# Patient Record
Sex: Female | Born: 1965 | Race: White | Hispanic: No | Marital: Married
Health system: Southern US, Community
[De-identification: ages and names within clinical notes are randomized; demographics above are authoritative.]

---

## 2005-07-10 ENCOUNTER — Ambulatory Visit (HOSPITAL_COMMUNITY): Admission: RE | Admit: 2005-07-10 | Discharge: 2005-07-10 | Payer: Self-pay | Admitting: Obstetrics and Gynecology

## 2005-07-14 ENCOUNTER — Inpatient Hospital Stay (HOSPITAL_COMMUNITY): Admission: AD | Admit: 2005-07-14 | Discharge: 2005-07-15 | Payer: Self-pay | Admitting: Obstetrics and Gynecology

## 2005-08-25 ENCOUNTER — Other Ambulatory Visit: Admission: RE | Admit: 2005-08-25 | Discharge: 2005-08-25 | Payer: Self-pay | Admitting: Obstetrics and Gynecology

## 2009-07-12 ENCOUNTER — Encounter: Admission: RE | Admit: 2009-07-12 | Discharge: 2009-07-12 | Payer: Self-pay | Admitting: Obstetrics and Gynecology

## 2010-05-04 ENCOUNTER — Encounter: Admission: RE | Admit: 2010-05-04 | Discharge: 2010-05-04 | Payer: Self-pay | Admitting: Gastroenterology

## 2010-05-19 ENCOUNTER — Ambulatory Visit (HOSPITAL_COMMUNITY): Admission: RE | Admit: 2010-05-19 | Discharge: 2010-05-19 | Payer: Self-pay | Admitting: Gastroenterology

## 2010-08-16 ENCOUNTER — Other Ambulatory Visit: Payer: Self-pay | Admitting: Gastroenterology

## 2010-08-21 ENCOUNTER — Encounter: Payer: Self-pay | Admitting: Gastroenterology

## 2010-09-22 ENCOUNTER — Other Ambulatory Visit (HOSPITAL_COMMUNITY): Payer: BC Managed Care – PPO

## 2010-09-27 ENCOUNTER — Encounter (HOSPITAL_COMMUNITY)
Admission: RE | Admit: 2010-09-27 | Discharge: 2010-09-27 | Disposition: A | Discharge status: Home / Self Care | Payer: BC Managed Care – PPO | Source: Ambulatory Visit | Attending: General Surgery | Admitting: General Surgery

## 2010-09-27 DIAGNOSIS — Z01812 Encounter for preprocedural laboratory examination: Secondary | ICD-10-CM | POA: Insufficient documentation

## 2010-09-27 LAB — COMPREHENSIVE METABOLIC PANEL
ALT: <8 U/L (ref 0–35)
AST: 14 U/L (ref 0–37)
Albumin: 3.5 g/dL (ref 3.5–5.2)
Alkaline Phosphatase: 58 U/L (ref 39–117)
BUN: 15 mg/dL (ref 6–23)
CO2: 30 mEq/L (ref 19–32)
Calcium: 9.3 mg/dL (ref 8.4–10.5)
Chloride: 106 mEq/L (ref 96–112)
GFR calc Af Amer: >60 mL/min (ref >60)
Glucose, Bld: 101 mg/dL — ABNORMAL HIGH (ref 70–99)
Potassium: 4.8 mEq/L (ref 3.5–5.1)
Sodium: 143 mEq/L (ref 135–145)
Total Protein: 6.3 g/dL (ref 6.0–8.3)

## 2010-09-27 LAB — CBC
HCT: 38.2 % (ref 36.0–46.0)
Hemoglobin: 12.3 g/dL (ref 12.0–15.0)
MCHC: 32.2 g/dL (ref 30.0–36.0)
MCV: 82.2 fL (ref 78.0–100.0)
Platelets: 322 10*3/uL (ref 150–400)
RBC: 4.65 MIL/uL (ref 3.87–5.11)
RDW: 14.5 % (ref 11.5–15.5)
WBC: 7.4 10*3/uL (ref 4.0–10.5)

## 2010-09-27 LAB — DIFFERENTIAL
Basophils Absolute: 0.1 10*3/uL (ref 0.0–0.1)
Basophils Relative: 1 % (ref 0–1)
Eosinophils Absolute: 0.2 10*3/uL (ref 0.0–0.7)
Eosinophils Relative: 3 % (ref 0–5)
Lymphocytes Relative: 28 % (ref 12–46)
Lymphs Abs: 2.1 10*3/uL (ref 0.7–4.0)
Monocytes Absolute: 0.7 10*3/uL (ref 0.1–1.0)
Monocytes Relative: 10 % (ref 3–12)

## 2010-09-27 LAB — SURGICAL PCR SCREEN
MRSA, PCR: NEGATIVE
Staphylococcus aureus: POSITIVE — AB

## 2010-09-29 ENCOUNTER — Observation Stay (HOSPITAL_COMMUNITY)
Admission: RE | Admit: 2010-09-29 | Discharge: 2010-09-30 | Disposition: A | Discharge status: Home / Self Care | Payer: BC Managed Care – PPO | Source: Ambulatory Visit | Attending: General Surgery | Admitting: General Surgery

## 2010-09-29 ENCOUNTER — Other Ambulatory Visit: Payer: Self-pay | Admitting: General Surgery

## 2010-09-29 ENCOUNTER — Inpatient Hospital Stay (HOSPITAL_COMMUNITY): Payer: BC Managed Care – PPO

## 2010-09-29 DIAGNOSIS — Z01818 Encounter for other preprocedural examination: Secondary | ICD-10-CM | POA: Insufficient documentation

## 2010-09-29 DIAGNOSIS — Z01812 Encounter for preprocedural laboratory examination: Secondary | ICD-10-CM | POA: Insufficient documentation

## 2010-09-29 DIAGNOSIS — K8010 Calculus of gallbladder with chronic cholecystitis without obstruction: Principal | ICD-10-CM | POA: Insufficient documentation

## 2010-10-03 ENCOUNTER — Ambulatory Visit (HOSPITAL_COMMUNITY): Admission: RE | Admit: 2010-10-03 | Payer: BC Managed Care – PPO | Source: Ambulatory Visit | Admitting: General Surgery

## 2010-10-03 NOTE — Op Note (Signed)
Erin [name], Erin [name]             ACCOUNT NO.:  [id number]  MEDICAL RECORD NO.:  [id number]           PATIENT TYPE:  I  LOCATION:  5122                         FACILITY:  [facility]  PHYSICIAN:  Adolph Pollack, M.D.DATE OF BIRTH:  [birth year]  DATE OF PROCEDURE:  09/29/2010 DATE OF DISCHARGE:                              OPERATIVE REPORT   PREOPERATIVE DIAGNOSIS:  Gallbladder polyp.  POSTOPERATIVE DIAGNOSIS:  Gallbladder polyp.  PROCEDURE:  Laparoscopic cholecystectomy with intraoperative cholangiogram.  SURGEON:  Adolph Pollack, MD  ASSISTANT:  Clovis Pu. Cornett, MD  ANESTHESIA:  General.  INDICATIONS:  Erin [name] is a 45 year old female who has had some nausea and vomiting and diarrhea.  She has been having episodes like this every 4-6 months.  She was started to have some epigastric pain.  She had an ultrasound demonstrating a 5-mm nonmobile lesion which was thought to be a polyp versus stone.  She had a normal gallbladder ejection fraction on a nuclear medicine study.  She had extensive workup by Dr. Dulce Sellar including upper endoscopy.  Nothing could really explain her symptoms and thus she presents now for laparoscopic cholecystectomy.  We discussed the procedure risks, aftercare, and the fact that this may or may not alleviate her symptoms and she understands this.  TECHNIQUE:  She was brought to the operating room and placed supine on the operating table and general anesthetic was administered.  The abdominal wall was sterilely prepped and draped.  Marcaine solution was infiltrated into the subumbilical region.  A small subumbilical incision was made through the skin and subcutaneous tissue until the midline fascia was identified.  A small incision was made in the midline and fascia, the fascia was retracted anteriorly, the peritoneal cavity was entered under direct vision.  A purse-string suture of 0 Vicryl was placed around the fascial edges.  A Hasson  trocar was introduced into the peritoneal cavity and pneumoperitoneum was created by insufflation of CO2 gas.  Next, a laparoscope was introduced.  A 5-mm trocar was placed through an epigastric incision and two 5-mm trocars were placed in the right upper quadrant.  She was placed in reverse Trendelenburg position, the right side tilted slightly up.  The fundus of the gallbladder was grasped. There were noted to be adhesions between the omentum and the duodenum and the body the gallbladder, which was lysed sharply allowing these structures to fall away from the gallbladder.  I then mobilized the infundibulum of the gallbladder and retracted it laterally.  Using blunt dissection, I was able to identify the cystic duct and create a window around it.  There appeared to be a small anterior cystic artery which I was able to clip and divide after making a window around it.  The critical view was achieved.  Following this, I placed a clip at the cystic duct-gallbladder junction and made a small incision through the cystic duct.  A cholangiocatheter was passed through the anterior abdominal wall into the cystic duct and a cholangiogram was performed.  Under real-time fluoroscopy, dilute contrast was injected into the cystic duct.  Cystic duct, common bile duct, common hepatic duct, and right and  left hepatic ducts were all visualized.  Contrast drained promptly into the duodenum.  There was no obvious evidence of obstruction.  Final reports pending the radiologist interpretation.  Following this, the cholangiocatheter was removed, the cystic duct was clipped 3 times on the biliary side and divided.  I then identified a posterior branch of the cystic artery and it was clipped and divided.  I began dissecting the gallbladder free from liver, but found what could be small blood vessels going directly from the midportion liver to the gallbladder.  The hepatic side of this was clipped and the  other area was divided with electrocautery.  Once the gallbladder was removed from the liver, I then placed an Endopouch bag and removed it through the subumbilical incision.  The subumbilical trocars were placed.  Bleeding was controlled with electrocautery.  Copious irrigation was performed and irrigation fluid evacuated.  Further inspection of the gallbladder fossa demonstrated no evidence of bleeding or bile leak.  A piece of Surgicel was placed in the gallbladder fossa.  Following this, I then removed the subumbilical trocar and closed the fascial defect under laparoscopic vision by tightening up and tying down the purse-string suture.  Skin incisions were closed with 4-0 Monocryl subcuticular stitches.  Steri-Strips and sterile dressings were applied.  She tolerated the procedure well without any apparent complications and was taken to the recovery room in satisfactory condition.     Adolph Pollack, M.D.     Kari Baars  D:  09/29/2010  T:  09/30/2010  Job:  [id number]  cc:   Willis Modena, MD Barry Dienes. Eloise Harman, M.D.  Electronically Signed by Avel Peace M.D. on 10/03/2010 09:53:53 AM

## 2010-10-17 NOTE — Discharge Summary (Signed)
  [name], [name]             ACCOUNT NO.:  [id number]  MEDICAL RECORD NO.:  [id number]           PATIENT TYPE:  I  LOCATION:  5122                         FACILITY:  [facility]  PHYSICIAN:  Adolph Pollack, M.D.DATE OF BIRTH:  1965-12-16  DATE OF ADMISSION:  09/29/2010 DATE OF DISCHARGE:  09/30/2010                              DISCHARGE SUMMARY   FINAL DISCHARGE DIAGNOSES:  Chronic cholecystitis and cholelithiasis.  PROCEDURE:  Laparoscopic cholecystectomy with cholangiogram, September 29, 2010.  INDICATIONS AND REASON FOR ADMISSION:  This 45 year old female has been having some nausea and vomiting as well as episodic epigastric pain.  An ultrasound demonstrated a 5-mm nonmobile lesion which was thought to be a polyp versus a stone.  She now presents for elective laparoscopic cholecystectomy.  She has had an extensive preoperative workup by Dr. Willis Modena.  HOSPITAL COURSE:  She underwent the above procedure and tolerated it well.  By her first postoperative day, she was tolerating a diet and feeling well and able to be discharged.  Of note was that her final pathology came back showing chronic cholecystitis and cholelithiasis.  DISPOSITION:  Discharged home in satisfactory condition.  She is given discharge instructions as well as pain medication.  Activity restrictions and dietary instructions were also given.  She will follow up in the office in 2-3 weeks.     Adolph Pollack, M.D.     Erin [name]  D:  10/08/2010  T:  10/09/2010  Job:  [id number]  cc:   Willis Modena, MD  Electronically Signed by Avel Peace M.D. on 10/17/2010 09:17:46 AM

## 2010-12-16 NOTE — Discharge Summary (Signed)
NAMEGRAELYN, Erin [name]             ACCOUNT NO.:  [id number]   MEDICAL RECORD NO.:  [id number]          PATIENT TYPE:  INP   LOCATION:  9121                          FACILITY:  [facility]   PHYSICIAN:  Huel Cote, M.D. DATE OF BIRTH:  [birth year]   DATE OF ADMISSION:  07/14/2005  DATE OF DISCHARGE:  07/15/2005                                 DISCHARGE SUMMARY   DISCHARGE DIAGNOSES:  1.  Term pregnancy at 40+ weeks delivered.  2.  Status post normal spontaneous vaginal delivery.   DISCHARGE MEDICATIONS:  1.  Motrin 600 mg p.o. every 6 hours.  2.  Percocet one to two tablets p.o. every 4 hours p.r.n.   HOSPITAL COURSE:  The patient is a 45 year old G4 P1-0-2-1 who was admitted  at 40+ weeks gestation for induction of labor given post dates and  favorable. Prenatal care was complicated by advanced maternal age with a  normal first trimester screen. Her quad screen risk was approximately 1/50;  however, she declined amniocentesis. History of postpartum depression, took  Zoloft with her pregnancy without problems. Prenatal labs are as follows:  B  positive, antibody negative, RPR nonreactive, rubella equivocal, hepatitis B  surface antigen negative, HIV declined, GC negative, chlamydia negative,  group B strep negative, triple screen 1/50, and 1-hour Glucola 106. Past  medical history:  History of anxiety and depression which was stable  throughout pregnancy off medications. Past surgical history:  None. Past OB  history:  She had a vacuum delivery in 2003 of an 8-pound 2-ounce infant  with a fourth-degree laceration. Past GYN history:  None. Allergies:  None.  Medications:  None. She was afebrile on admission with stable vital signs.  Fetal heart rate was reactive. Cervix was 70, 3+, and -2 station. She had  rupture of membranes with clear fluid noted and was placed on Pitocin. She  progressed well throughout the day, reached complete dilation, and pushed  for approximately 10 minutes  with a normal spontaneous vaginal delivery of a  vigorous female infant over a second-degree laceration. Apgars were 9 and 9,  weight was 8 pounds 14 ounces. Placenta delivered spontaneously. Placenta  was sent for cord blood donation and cervix and rectum were intact.  Estimated blood loss was 400 cc. She had a large amount of clot evacuated  from her uterus and then good contraction of the fundus was noted. Estimated  blood loss was approximately 400 mL. Second-degree laceration was repaired  with 2-0 Vicryl. She did quite well and on postpartum day #1 her hemoglobin  was 8.9. Fundus was firm, bleeding was normal, and she requested early  discharge if possible. She was granted early discharge and will follow up in  the office for her routine 6-week postpartum visit.      Huel Cote, M.D.  Electronically Signed     KR/MEDQ  D:  08/16/2005  T:  08/16/2005  Job:  [id number]

## 2011-05-10 ENCOUNTER — Other Ambulatory Visit: Payer: Self-pay | Admitting: Obstetrics and Gynecology

## 2011-05-10 DIAGNOSIS — Z1231 Encounter for screening mammogram for malignant neoplasm of breast: Secondary | ICD-10-CM

## 2011-05-15 ENCOUNTER — Ambulatory Visit: Payer: BC Managed Care – PPO

## 2011-05-21 IMAGING — NM NM HEPATO W/GB/PHARM/[PERSON_NAME]
2 series · 7 of 7 positions shown · non-contrast
Comparison: Ultrasound 05/04/2010.

CLINICAL DATA: Abdominal pain

NUCLEAR MEDICINE HEPATOBILIARY IMAGING
TECHNIQUE: Sequential images of the abdomen were obtained [DATE] minutes following intravenous administration of
radiopharmaceutical.
Radiopharmaceutical:  5.2 mCi Mc-99m Choletec
Gallbladder ejection fraction was calculated after administration
of 1.7 mcg CCK.  Post CCK the patient had no pain.

[Series 1: he hepato · 4.75mm/px · 1 of 1 slices shown (1 of 2)]
[im 1/1]
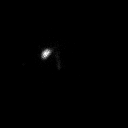

[Series 1: he hepato · 4.75mm/px · 6 of 60 frames shown (2 of 2)]
[frame 6/60]
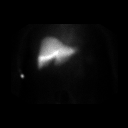
[frame 16/60]
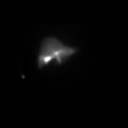
[frame 26/60]
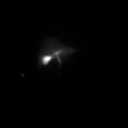
[frame 36/60]
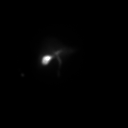
[frame 46/60]
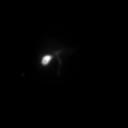
[frame 56/60]
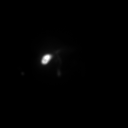

[7 of 7 positions shown; findings below may reference images not displayed]

FINDINGS: There is normal uptake of the tracer by the liver.
Gallbladder is visualized at 15 minutes.   CBD is visualized at 25
minutes.

Post CCK gallbladder ejection fraction is 72.8%.  Normal
gallbladder ejection fraction should be greater than 30%.
IMPRESSION: No cystic duct obstruction.  Post CCK gallbladder ejection fraction
is 72.8%.

## 2011-10-01 IMAGING — RF DG CHOLANGIOGRAM OPERATIVE
1 series · 5 of 5 positions shown · non-contrast
Comparison: None.

CLINICAL DATA: Laparoscopic cholecystectomy

INTRAOPERATIVE CHOLANGIOGRAM
TECHNIQUE: Cholangiographic images from the C-arm fluoroscopic
device were submitted for interpretation post-operatively.  Please
see the procedural report for the amount of contrast and the
fluoroscopy time utilized.

[Series 1: run · 2 acquisitions, 5 frames shown]
[im 1/2]
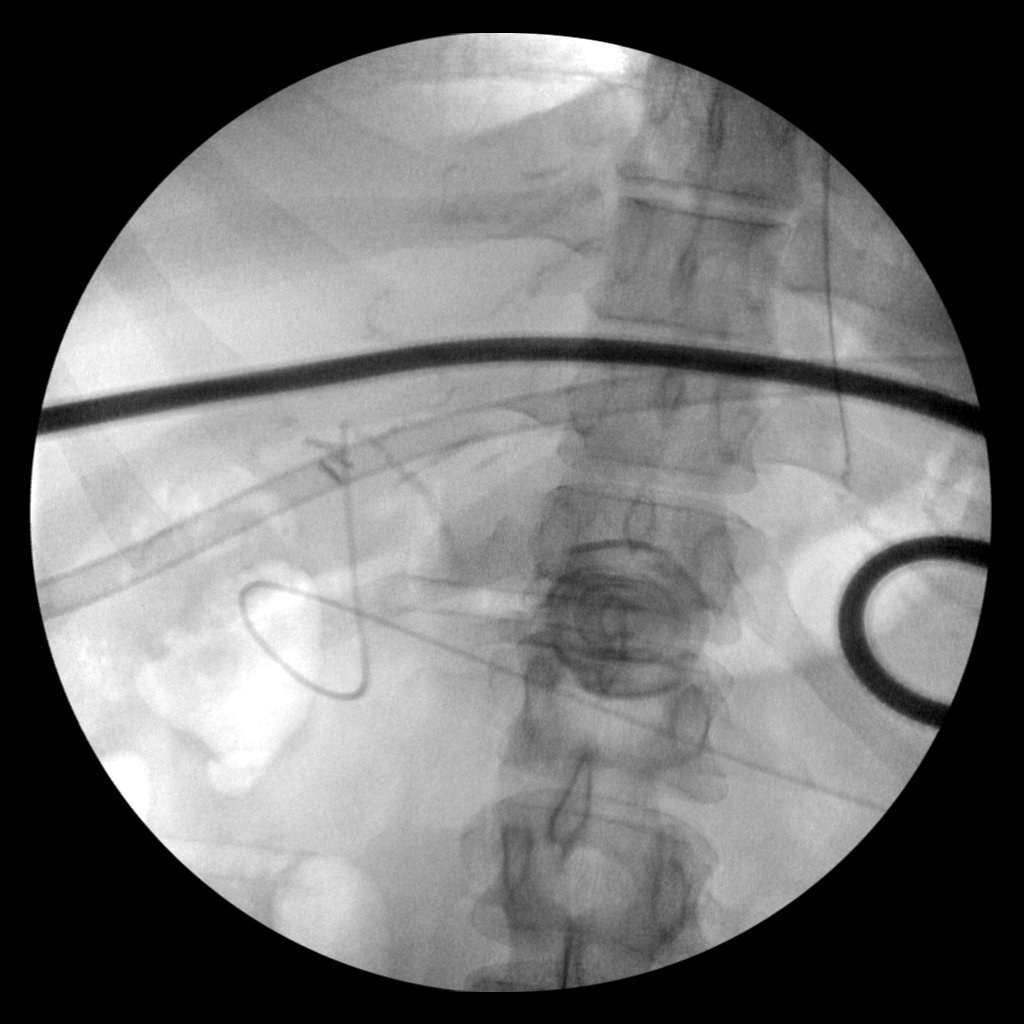
[im 1/2]
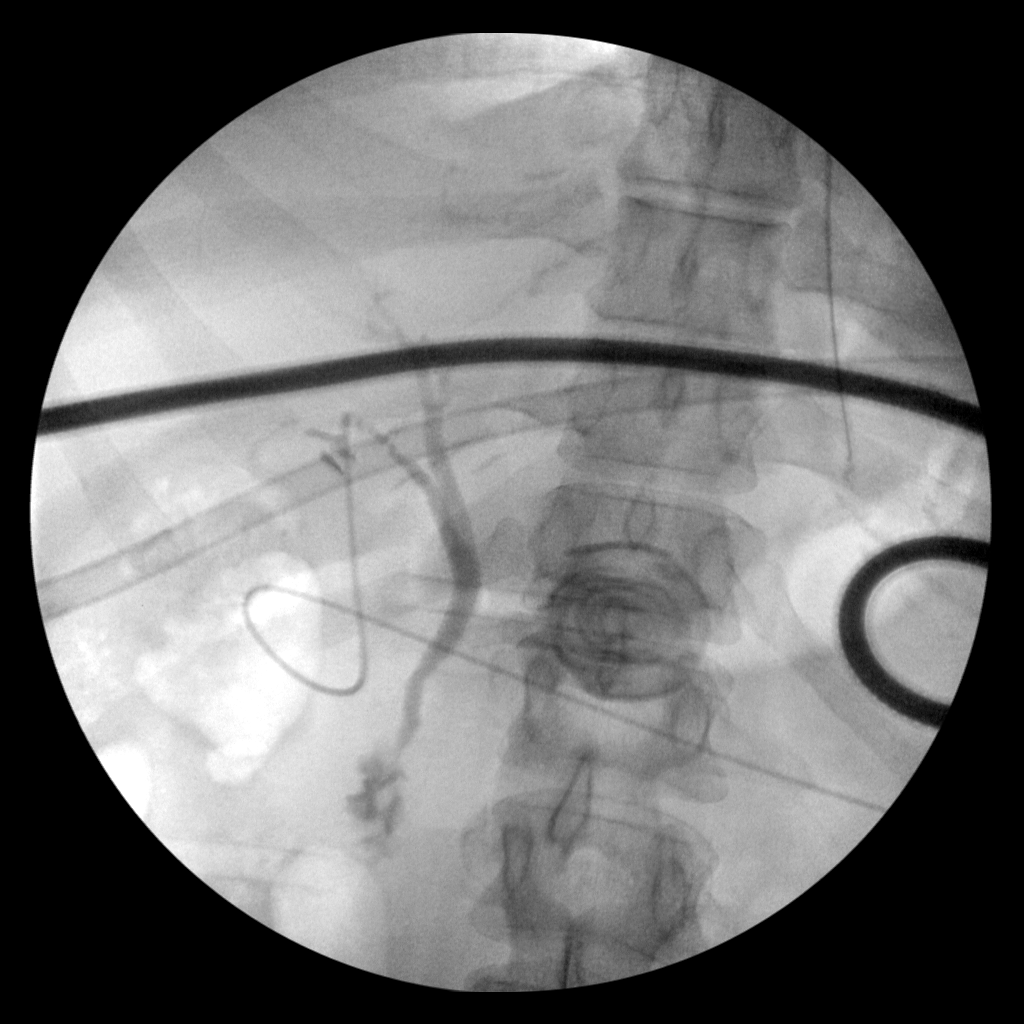
[im 1/2]
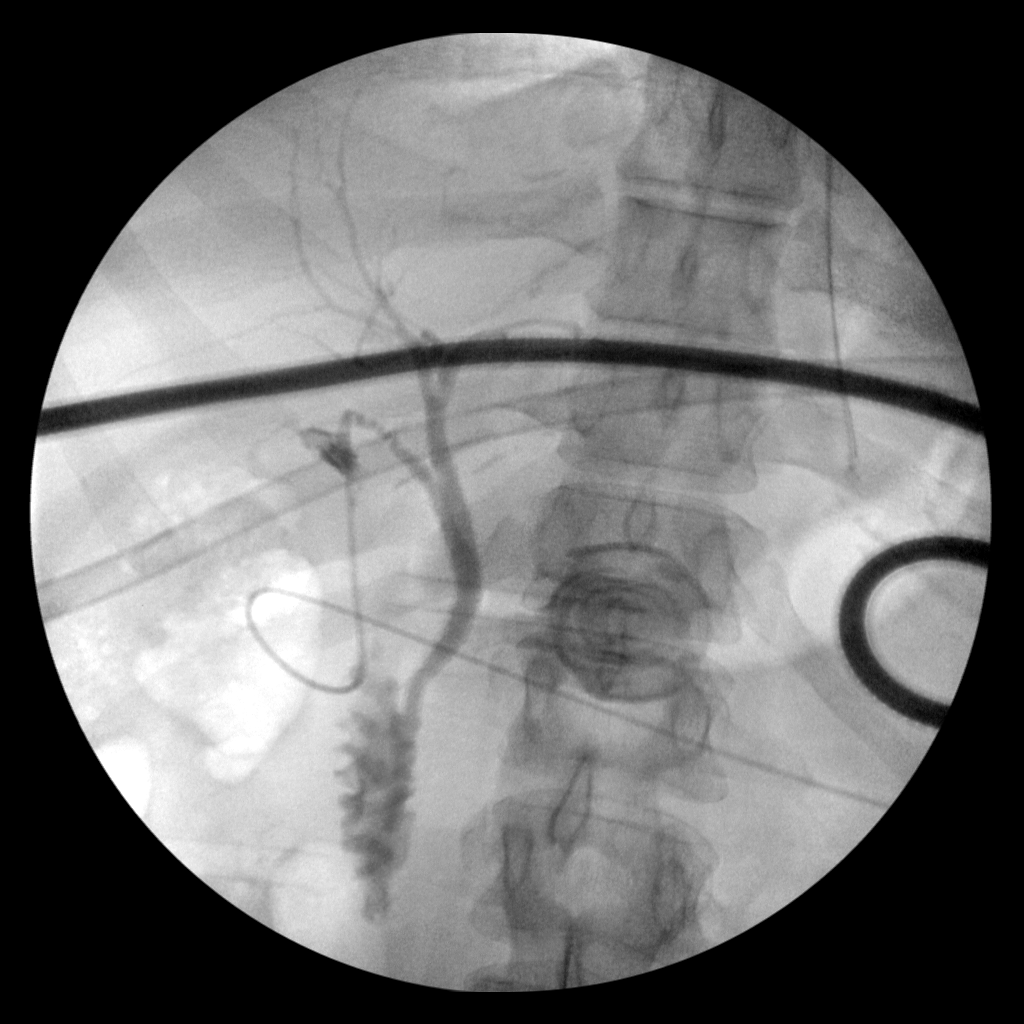
[im 1/2]
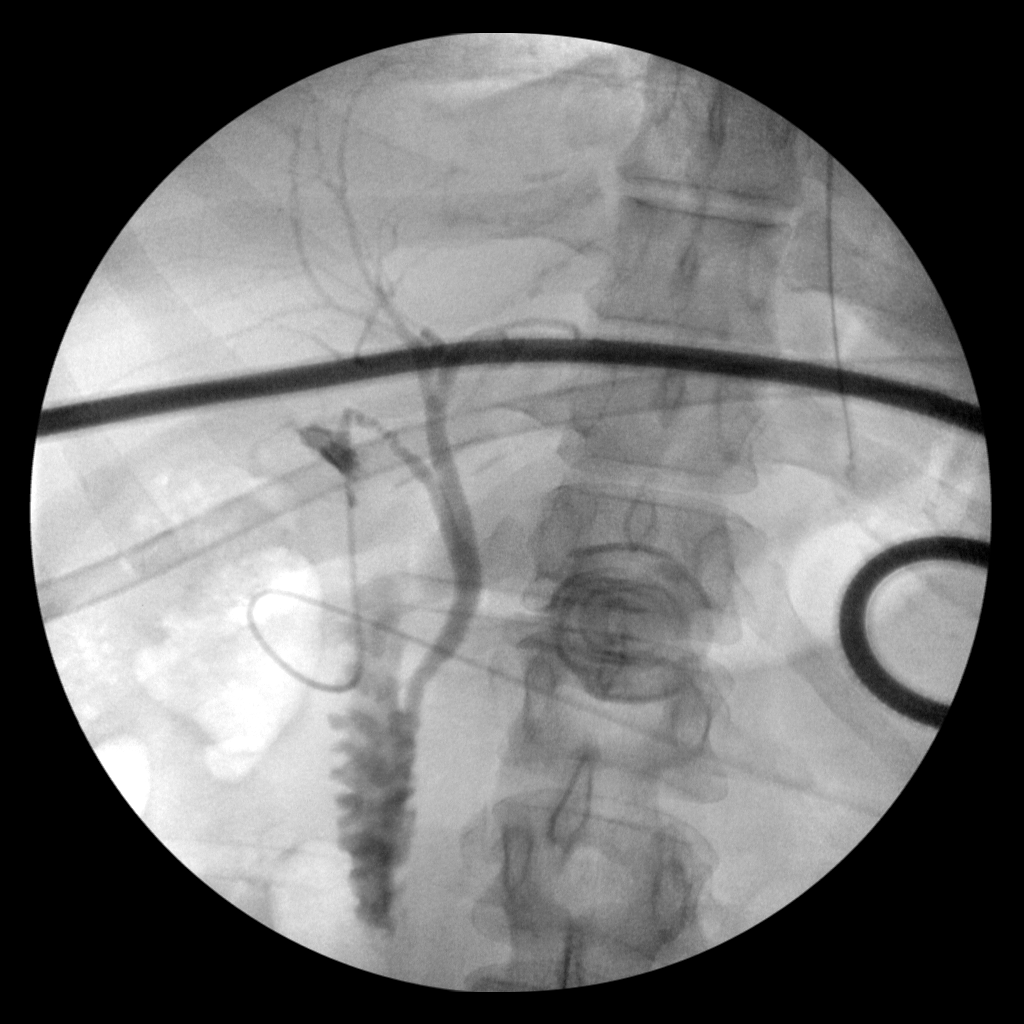
[im 2/2]
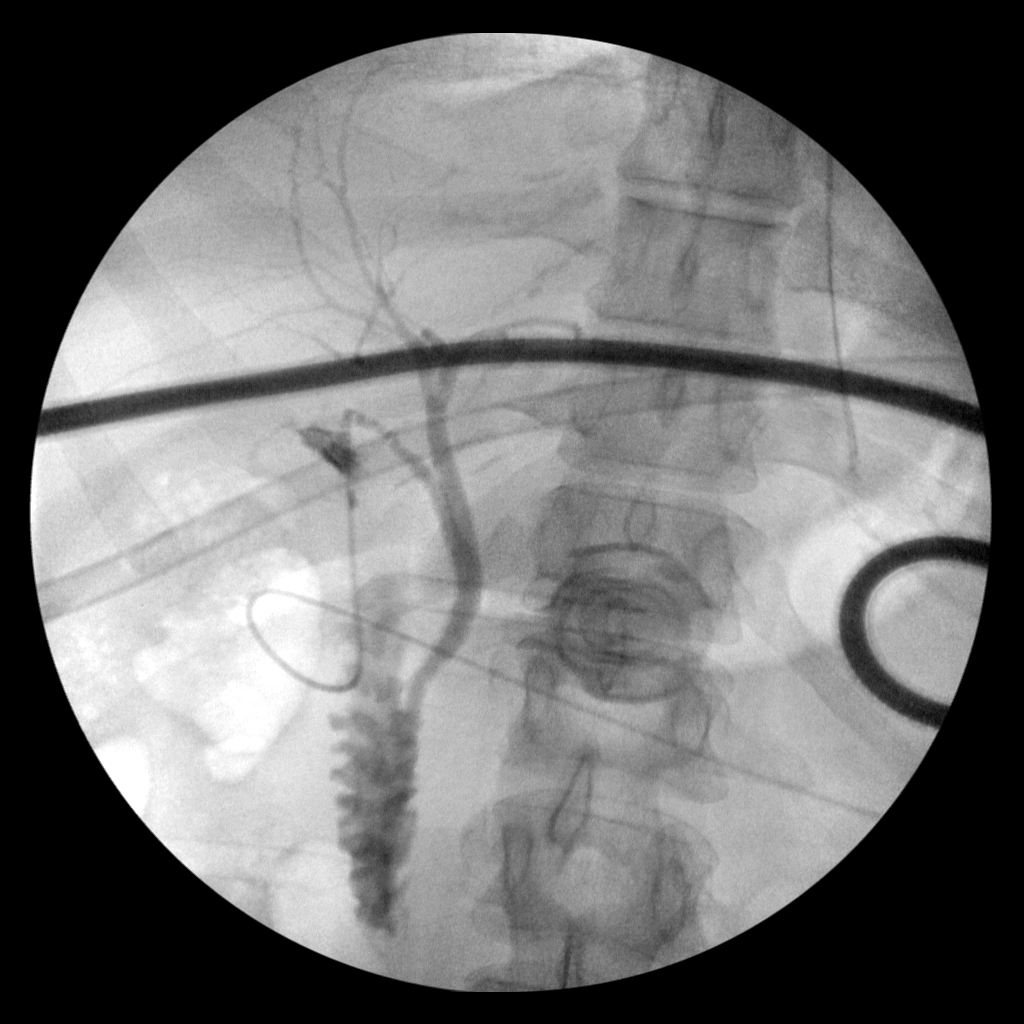

[5 of 5 positions shown; findings below may reference images not displayed]

FINDINGS: Normal intra and extrahepatic biliary duct caliber with
no retained stones or obstruction.

The cystic duct remnant is relatively long and inserts relatively
low into the biliary system.
IMPRESSION: 1.  Long and low inserting cystic duct remnant.
2.  Otherwise unremarkable.

## 2019-07-21 ENCOUNTER — Ambulatory Visit: Payer: BLUE CROSS/BLUE SHIELD | Attending: Internal Medicine

## 2019-07-21 DIAGNOSIS — Z20822 Contact with and (suspected) exposure to covid-19: Secondary | ICD-10-CM

## 2019-07-22 LAB — NOVEL CORONAVIRUS, NAA: SARS-CoV-2, NAA: NOT DETECTED

## 2019-08-20 ENCOUNTER — Ambulatory Visit: Payer: BLUE CROSS/BLUE SHIELD | Attending: Internal Medicine

## 2019-08-20 DIAGNOSIS — Z20822 Contact with and (suspected) exposure to covid-19: Secondary | ICD-10-CM

## 2019-08-21 LAB — NOVEL CORONAVIRUS, NAA: SARS-CoV-2, NAA: NOT DETECTED

## 2019-09-17 ENCOUNTER — Ambulatory Visit: Payer: BLUE CROSS/BLUE SHIELD | Attending: Internal Medicine

## 2019-09-17 DIAGNOSIS — Z20822 Contact with and (suspected) exposure to covid-19: Secondary | ICD-10-CM

## 2019-09-19 LAB — NOVEL CORONAVIRUS, NAA: SARS-CoV-2, NAA: NOT DETECTED

## 2023-01-13 ENCOUNTER — Emergency Department (HOSPITAL_BASED_OUTPATIENT_CLINIC_OR_DEPARTMENT_OTHER): Payer: BLUE CROSS/BLUE SHIELD

## 2023-01-13 ENCOUNTER — Emergency Department (HOSPITAL_BASED_OUTPATIENT_CLINIC_OR_DEPARTMENT_OTHER)
Admission: EM | Admit: 2023-01-13 | Discharge: 2023-01-13 | Disposition: A | Discharge status: Home / Self Care | Payer: BLUE CROSS/BLUE SHIELD | Attending: Emergency Medicine | Admitting: Emergency Medicine

## 2023-01-13 ENCOUNTER — Other Ambulatory Visit: Payer: Self-pay

## 2023-01-13 DIAGNOSIS — W010XXA Fall on same level from slipping, tripping and stumbling without subsequent striking against object, initial encounter: Secondary | ICD-10-CM | POA: Diagnosis not present

## 2023-01-13 DIAGNOSIS — M542 Cervicalgia: Secondary | ICD-10-CM | POA: Diagnosis not present

## 2023-01-13 DIAGNOSIS — Z23 Encounter for immunization: Secondary | ICD-10-CM | POA: Diagnosis not present

## 2023-01-13 DIAGNOSIS — S0101XA Laceration without foreign body of scalp, initial encounter: Secondary | ICD-10-CM | POA: Diagnosis present

## 2023-01-13 MED ORDER — TETANUS-DIPHTH-ACELL PERTUSSIS 5-2.5-18.5 LF-MCG/0.5 IM SUSY
0.5000 mL | PREFILLED_SYRINGE | Freq: Once | INTRAMUSCULAR | Status: AC
  Administered 2023-01-13: 0.5 mL via INTRAMUSCULAR
  Filled 2023-01-13: qty 0.5

## 2023-01-13 NOTE — ED Triage Notes (Signed)
Fell down 2 steps. She hit her head (posterior. Left ) on brick. No LOC. Pain only at the site

## 2023-01-13 NOTE — ED Provider Notes (Signed)
[name] Provider Note   CSN: [id number] Arrival date & time: 01/13/23  1318     History  Chief Complaint  Patient presents with   Erin [name]    Erin [name] is a 57 y.o. female.  57 year old female presents after fall just prior to arrival.  Patient states that she lost her footing.  Struck the back of her head.  No loss of consciousness but did have emesis x 1.  Has laceration to her left occipital region.  Slight neck pain.  No peripheral weakness.  No lower back, abdominal, chest discomfort.  Her bleeding is controlled with direct pressure       Home Medications Prior to Admission medications   Not on File      Allergies    Patient has no allergy information on record.    Review of Systems   Review of Systems  All other systems reviewed and are negative.   Physical Exam Updated Vital Signs BP 122/84 (BP Location: Right Arm)   Pulse 78   Temp 98 F (36.7 C) (Oral)   Resp 16   Ht 1.575 m (5\' 2" )   Wt 70.3 kg   SpO2 95%   BMI 28.35 kg/m  Physical Exam Vitals and nursing note reviewed.  Constitutional:      General: She is not in acute distress.    Appearance: Normal appearance. She is well-developed. She is not toxic-appearing.  HENT:     Head:   Eyes:     General: Lids are normal.     Conjunctiva/sclera: Conjunctivae normal.     Pupils: Pupils are equal, round, and reactive to light.  Neck:     Thyroid: No thyroid mass.     Trachea: No tracheal deviation.  Cardiovascular:     Rate and Rhythm: Normal rate and regular rhythm.     Heart sounds: Normal heart sounds. No murmur heard.    No gallop.  Pulmonary:     Effort: Pulmonary effort is normal. No respiratory distress.     Breath sounds: Normal breath sounds. No stridor. No decreased breath sounds, wheezing, rhonchi or rales.  Abdominal:     General: There is no distension.     Palpations: Abdomen is soft.     Tenderness: There is no abdominal  tenderness. There is no rebound.  Musculoskeletal:        General: No tenderness. Normal range of motion.     Cervical back: Normal range of motion and neck supple. Muscular tenderness present.  Skin:    General: Skin is warm and dry.     Findings: No abrasion or rash.  Neurological:     General: No focal deficit present.     Mental Status: She is alert and oriented to person, place, and time. Mental status is at baseline.     GCS: GCS eye subscore is 4. GCS verbal subscore is 5. GCS motor subscore is 6.     Cranial Nerves: No cranial nerve deficit.     Sensory: No sensory deficit.     Motor: Motor function is intact.  Psychiatric:        Attention and Perception: Attention normal.        Speech: Speech normal.        Behavior: Behavior normal.     ED Results / Procedures / Treatments   Labs (all labs ordered are listed, but only abnormal results are displayed) Labs Reviewed - No data to display  EKG None  Radiology No results found.  Procedures .Marland KitchenLaceration Repair  Date/Time: 01/13/2023 3:05 PM  Performed by: Lorre Nick, MD Authorized by: Lorre Nick, MD   Consent:    Consent obtained:  Verbal   Consent given by:  Patient   Alternatives discussed:  No treatment Universal protocol:    Immediately prior to procedure, a time out was called: yes     Patient identity confirmed:  Arm band Anesthesia:    Anesthesia method:  None Laceration details:    Location:  Scalp   Length (cm):  1 Pre-procedure details:    Preparation:  Patient was prepped and draped in usual sterile fashion Exploration:    Limited defect created (wound extended): no     Contaminated: no   Treatment:    Area cleansed with:  Chlorhexidine and saline   Amount of cleaning:  Standard   Debridement:  None   Scar revision: no   Skin repair:    Repair method:  Staples Approximation:    Approximation:  Loose Repair type:    Repair type:  Simple Post-procedure details:    Dressing:   Open (no dressing)     Medications Ordered in ED Medications - No data to display  ED Course/ Medical Decision Making/ A&P                             Medical Decision Making Amount and/or Complexity of Data Reviewed Radiology: ordered.  Patient is CT of head and cervical spine showed no acute fracture.  Laceration repaired as above.  Tetanus status updated.  Will discharge home         Final Clinical Impression(s) / ED Diagnoses Final diagnoses:  None    Rx / DC Orders ED Discharge Orders     None         Lorre Nick, MD 01/13/23 1507

## 2023-01-13 NOTE — Discharge Instructions (Signed)
Staples out in 7-10 days.
